# Patient Record
Sex: Female | Born: 1965 | Race: Black or African American | Hispanic: No | Marital: Single | Smoking: Current every day smoker
Health system: Southern US, Community
[De-identification: ages and names within clinical notes are randomized; demographics above are authoritative.]

## PROBLEM LIST (undated history)

## (undated) HISTORY — PX: ABDOMINAL HYSTERECTOMY: SHX81

---

## 2004-04-29 ENCOUNTER — Emergency Department (HOSPITAL_COMMUNITY): Admission: EM | Admit: 2004-04-29 | Discharge: 2004-04-29 | Payer: Self-pay | Admitting: Emergency Medicine

## 2007-06-23 ENCOUNTER — Emergency Department (HOSPITAL_COMMUNITY): Admission: EM | Admit: 2007-06-23 | Discharge: 2007-06-23 | Payer: Self-pay | Admitting: Emergency Medicine

## 2008-06-22 ENCOUNTER — Emergency Department (HOSPITAL_COMMUNITY): Admission: EM | Admit: 2008-06-22 | Discharge: 2008-06-22 | Payer: Self-pay | Admitting: Emergency Medicine

## 2008-07-12 ENCOUNTER — Encounter: Admission: RE | Admit: 2008-07-12 | Discharge: 2008-07-12 | Payer: Self-pay | Admitting: Orthopedic Surgery

## 2009-03-05 ENCOUNTER — Emergency Department (HOSPITAL_COMMUNITY): Admission: EM | Admit: 2009-03-05 | Discharge: 2009-03-05 | Payer: Self-pay | Admitting: *Deleted

## 2011-04-05 LAB — POCT I-STAT, CHEM 8
Creatinine, Ser: 0.9 mg/dL (ref 0.4–1.2)
Glucose, Bld: 95 mg/dL (ref 70–99)
Potassium: 4 mEq/L (ref 3.5–5.1)
Sodium: 141 mEq/L (ref 135–145)

## 2011-04-05 LAB — DIFFERENTIAL
Basophils Absolute: 0 10*3/uL (ref 0.0–0.1)
Basophils Relative: 1 % (ref 0–1)
Lymphs Abs: 2 10*3/uL (ref 0.7–4.0)
Monocytes Relative: 8 % (ref 3–12)
Neutro Abs: 3.6 10*3/uL (ref 1.7–7.7)
Neutrophils Relative %: 56 % (ref 43–77)

## 2011-04-05 LAB — CBC
HCT: 38 % (ref 36.0–46.0)
Hemoglobin: 12.8 g/dL (ref 12.0–15.0)
Platelets: 206 10*3/uL (ref 150–400)
WBC: 6.3 10*3/uL (ref 4.0–10.5)

## 2011-04-05 LAB — SEDIMENTATION RATE: Sed Rate: 4 mm/hr (ref 0–22)

## 2011-10-10 LAB — COMPREHENSIVE METABOLIC PANEL
ALT: 27
AST: 27
Albumin: 4
BUN: 9
CO2: 26
Calcium: 9.5
Chloride: 106
Creatinine, Ser: 0.66
Glucose, Bld: 93

## 2011-10-10 LAB — CBC
HCT: 38.2
RBC: 4.22

## 2011-10-10 LAB — URINALYSIS, ROUTINE W REFLEX MICROSCOPIC
Bilirubin Urine: NEGATIVE
Glucose, UA: NEGATIVE
Hgb urine dipstick: NEGATIVE
Ketones, ur: NEGATIVE
Nitrite: NEGATIVE
Urobilinogen, UA: 0.2

## 2011-10-10 LAB — DIFFERENTIAL
Basophils Absolute: 0
Lymphocytes Relative: 30

## 2018-07-19 DIAGNOSIS — Z5321 Procedure and treatment not carried out due to patient leaving prior to being seen by health care provider: Secondary | ICD-10-CM | POA: Insufficient documentation

## 2018-07-19 DIAGNOSIS — K625 Hemorrhage of anus and rectum: Secondary | ICD-10-CM | POA: Insufficient documentation

## 2018-07-20 ENCOUNTER — Emergency Department (HOSPITAL_COMMUNITY)
Admission: EM | Admit: 2018-07-20 | Discharge: 2018-07-20 | Disposition: A | Discharge status: Home / Self Care | Payer: Self-pay | Attending: Emergency Medicine | Admitting: Emergency Medicine

## 2018-07-20 ENCOUNTER — Encounter (HOSPITAL_COMMUNITY): Payer: Self-pay | Admitting: Nurse Practitioner

## 2018-07-20 NOTE — ED Triage Notes (Signed)
PT states she currently experiencing moderate rectal bleeding, blight red blood. States started about 2 months ago as "just spotting but it has progressivessly worse." Pt denies any GI hx including hemorrhoids.

## 2018-07-20 NOTE — ED Notes (Signed)
Pt told registration that she was leaving.  

## 2018-07-21 NOTE — ED Notes (Signed)
Follow up call made  No answer  07/21/18  1248  s Tinamarie Przybylski rn

## 2021-04-16 ENCOUNTER — Emergency Department (HOSPITAL_BASED_OUTPATIENT_CLINIC_OR_DEPARTMENT_OTHER): Payer: Self-pay

## 2021-04-16 ENCOUNTER — Encounter (HOSPITAL_BASED_OUTPATIENT_CLINIC_OR_DEPARTMENT_OTHER): Payer: Self-pay | Admitting: Emergency Medicine

## 2021-04-16 ENCOUNTER — Emergency Department (HOSPITAL_BASED_OUTPATIENT_CLINIC_OR_DEPARTMENT_OTHER)
Admission: EM | Admit: 2021-04-16 | Discharge: 2021-04-16 | Disposition: A | Discharge status: Home / Self Care | Payer: Self-pay | Attending: Emergency Medicine | Admitting: Emergency Medicine

## 2021-04-16 ENCOUNTER — Other Ambulatory Visit: Payer: Self-pay

## 2021-04-16 DIAGNOSIS — M549 Dorsalgia, unspecified: Secondary | ICD-10-CM | POA: Insufficient documentation

## 2021-04-16 DIAGNOSIS — Z23 Encounter for immunization: Secondary | ICD-10-CM | POA: Insufficient documentation

## 2021-04-16 DIAGNOSIS — S0990XA Unspecified injury of head, initial encounter: Secondary | ICD-10-CM | POA: Insufficient documentation

## 2021-04-16 DIAGNOSIS — W010XXA Fall on same level from slipping, tripping and stumbling without subsequent striking against object, initial encounter: Secondary | ICD-10-CM | POA: Insufficient documentation

## 2021-04-16 DIAGNOSIS — S6992XA Unspecified injury of left wrist, hand and finger(s), initial encounter: Secondary | ICD-10-CM | POA: Insufficient documentation

## 2021-04-16 DIAGNOSIS — S01511A Laceration without foreign body of lip, initial encounter: Secondary | ICD-10-CM | POA: Insufficient documentation

## 2021-04-16 DIAGNOSIS — W19XXXA Unspecified fall, initial encounter: Secondary | ICD-10-CM

## 2021-04-16 DIAGNOSIS — G8929 Other chronic pain: Secondary | ICD-10-CM | POA: Insufficient documentation

## 2021-04-16 DIAGNOSIS — F17290 Nicotine dependence, other tobacco product, uncomplicated: Secondary | ICD-10-CM | POA: Insufficient documentation

## 2021-04-16 DIAGNOSIS — Y92524 Gas station as the place of occurrence of the external cause: Secondary | ICD-10-CM | POA: Insufficient documentation

## 2021-04-16 MED ORDER — IBUPROFEN 400 MG PO TABS
400.0000 mg | ORAL_TABLET | Freq: Once | ORAL | Status: AC
  Administered 2021-04-16: 400 mg via ORAL
  Filled 2021-04-16: qty 1

## 2021-04-16 MED ORDER — METHYLPREDNISOLONE 4 MG PO TBPK: ORAL_TABLET | ORAL | 0 refills | Status: AC

## 2021-04-16 MED ORDER — TETANUS-DIPHTH-ACELL PERTUSSIS 5-2.5-18.5 LF-MCG/0.5 IM SUSY
0.5000 mL | PREFILLED_SYRINGE | Freq: Once | INTRAMUSCULAR | Status: AC
  Administered 2021-04-16: 0.5 mL via INTRAMUSCULAR
  Filled 2021-04-16: qty 0.5

## 2021-04-16 NOTE — Discharge Instructions (Signed)
Take steroids for acute back pain.  Recommend 600 mg of ibuprofen every 8 hours as needed for pain.  Recommend 1000 mg of Tylenol every 6 hours as needed for pain as well.

## 2021-04-16 NOTE — ED Provider Notes (Signed)
Patient with mechanical fall.  Acute on chronic back pain.  CT head and neck unremarkable.  X-rays of the low back and hand unremarkable.  Will prescribe Medrol Dosepak.  Discharged in good condition.   Virgina Norfolk, DO 04/16/21 [phone]
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
## 2021-04-16 NOTE — ED Notes (Signed)
Patient transported to CT/Xray. 
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
## 2021-04-16 NOTE — ED Notes (Signed)
Patient verbalized understanding of dc instructions, prescriptions, follow up referrals and reasons to return to ER for reevaluation.  
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
